# Patient Record
Sex: Female | Born: 2000 | Race: Black or African American | Hispanic: No | Marital: Single | Smoking: Never smoker
Health system: Southern US, Community
[De-identification: ages and names within clinical notes are randomized; demographics above are authoritative.]

---
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
## 2018-12-03 ENCOUNTER — Other Ambulatory Visit: Payer: Self-pay

## 2018-12-03 DIAGNOSIS — Z20822 Contact with and (suspected) exposure to covid-19: Secondary | ICD-10-CM

## 2018-12-05 LAB — NOVEL CORONAVIRUS, NAA: SARS-CoV-2, NAA: NOT DETECTED

## 2023-12-05 ENCOUNTER — Other Ambulatory Visit (HOSPITAL_COMMUNITY)
Admission: RE | Admit: 2023-12-05 | Discharge: 2023-12-05 | Disposition: A | Discharge status: Home / Self Care | Source: Ambulatory Visit | Attending: Certified Nurse Midwife | Admitting: Certified Nurse Midwife

## 2023-12-05 ENCOUNTER — Ambulatory Visit: Payer: Self-pay | Admitting: Certified Nurse Midwife

## 2023-12-05 ENCOUNTER — Encounter: Payer: Self-pay | Admitting: Certified Nurse Midwife

## 2023-12-05 VITALS — BP 124/82 | HR 77 | Ht 64.0 in | Wt 159.0 lb

## 2023-12-05 DIAGNOSIS — Z124 Encounter for screening for malignant neoplasm of cervix: Secondary | ICD-10-CM

## 2023-12-05 DIAGNOSIS — F419 Anxiety disorder, unspecified: Secondary | ICD-10-CM | POA: Insufficient documentation

## 2023-12-05 DIAGNOSIS — Z1331 Encounter for screening for depression: Secondary | ICD-10-CM

## 2023-12-05 DIAGNOSIS — Z30011 Encounter for initial prescription of contraceptive pills: Secondary | ICD-10-CM | POA: Insufficient documentation

## 2023-12-05 DIAGNOSIS — Z01419 Encounter for gynecological examination (general) (routine) without abnormal findings: Secondary | ICD-10-CM

## 2023-12-05 DIAGNOSIS — Z0000 Encounter for general adult medical examination without abnormal findings: Secondary | ICD-10-CM | POA: Insufficient documentation

## 2023-12-05 NOTE — Progress Notes (Signed)
 Patient presents for Annual. PHQ9=5  GAD 7=5 LMP: 11/19/23 Monthly light flow lasting 2-3 days Last pap: 2024 at [facility]  Needs to sign ROI Contraception: OCP since 2016  Mammogram: Not yet indicated Family Hx of Breast Breast Cancer P.Cousin STD Screening: Accepts  CC: Anxiety w/School and everyday life stressors.

## 2023-12-05 NOTE — Progress Notes (Signed)
   GYNECOLOGY OFFICE VISIT NOTE  History:   Lacey Carter is a 23 y.o. G0P0000 here today for a annual well woman exam. Patient is a transfer to us  from [facility]. 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 She is currently on OCPs and has no complaints. She reports a normal 21-25 day cycle with a light 3-5 day bleed. She is currently sexually active with a long-term boyfriend. She desires STD testing today. She denies any abnormal vaginal discharge, bleeding, pelvic pain or other concerns.  She does report a generalized anxiety that she has notice worsen with age. She states that sometimes it becomes debilitating and is unable to perform tasks. Denies having a counselor or coping mechanisms for stress. Denies being on anti-anxiety medication     History reviewed. No pertinent past medical history.  History reviewed. No pertinent surgical history.  The following portions of the patient's history were reviewed and updated as appropriate: allergies, current medications, past family history, past medical history, past social history, past surgical history and problem list.   Health Maintenance:  Patient denies a hx of abnormal PAP. Plan to complete testing today.   Review of Systems:  Pertinent items noted in HPI and remainder of comprehensive ROS otherwise negative.  Physical Exam:  BP 124/82   Pulse 77   Ht 5' 4 (1.626 m)   Wt 159 lb (72.1 kg)   LMP 11/19/2023 (Approximate)   BMI 27.29 kg/m  CONSTITUTIONAL: Well-developed, well-nourished female in no acute distress.  HEENT:  Normocephalic, atraumatic. External right and left ear normal. No scleral icterus.  NECK: Normal range of motion, supple, no masses noted on observation SKIN: No rash noted. Not diaphoretic. No erythema. No pallor. MUSCULOSKELETAL: Normal range of motion. No edema noted. NEUROLOGIC: Alert and oriented to person, place, and time. Normal muscle tone coordination. No cranial nerve deficit  noted. PSYCHIATRIC: Normal mood and affect. Normal behavior. Normal judgment and thought content. CARDIOVASCULAR: Normal heart rate noted RESPIRATORY: Effort and breath sounds normal, no problems with respiration noted ABDOMEN: No masses noted. No other overt distention noted.   PELVIC: Normal appearing external genitalia; normal urethral meatus; normal appearing vaginal mucosa and cervix.  No abnormal discharge noted.  Normal uterine size, no other palpable masses, no uterine or adnexal tenderness. Performed in the presence of a chaperone L. Lash CMA   Labs and Imaging No results found for this or any previous visit (from the past week). No results found.    Assessment and Plan:    1. Annual physical exam (Primary) - Patient doing well.   2. Pap smear for cervical cancer screening - Pap collected today.  - Cytology - PAP  3. Anxiety - Recommended to start with the use of a counselor. And if care needs to escalate to medication or psych then we can. Patient agreeable to plan of care.  - AMB referral to [facility]  - Cytology - PAP   Routine preventative health maintenance measures emphasized. Please refer to After Visit Summary for other counseling recommendations.   Return in about 1 year (around 12/04/2024) for [place].    I spent 30 minutes dedicated to the care of this patient including pre-visit review of records, face to face time with the patient discussing her conditions and treatments and post visit orders.    Radley Barto Erven) Emilio, MSN, CNM  Center for [facility]  12/05/23 5:56 PM

## 2023-12-06 ENCOUNTER — Telehealth: Payer: Self-pay | Admitting: Clinical

## 2023-12-06 NOTE — Telephone Encounter (Signed)
Attempt call regarding referral; Left HIPPA-compliant message to call back Mikle Sternberg from [facility] at [facility] at  [phone] (Ashelyn Mccravy's office).    

## 2023-12-11 LAB — CYTOLOGY - PAP
Comment: NEGATIVE
Diagnosis: NEGATIVE
High risk HPV: NEGATIVE

## 2023-12-24 ENCOUNTER — Ambulatory Visit: Payer: Self-pay

## 2023-12-24 DIAGNOSIS — F411 Generalized anxiety disorder: Secondary | ICD-10-CM | POA: Diagnosis not present

## 2023-12-24 NOTE — Patient Instructions (Signed)
[facility] at [facility] [address] [place], [place] [address] [phone] (main office) [phone] (Jamie's office)  SCORE: For the life of your business [email or web address]

## 2023-12-24 NOTE — BH Specialist Note (Unsigned)
 Integrated Behavioral Health via Telemedicine Visit  12/25/2023 Lacey Carter [id number]  Number of Integrated Behavioral Health Clinician visits: 1- Initial Visit  Session Start time: 1201   Session End time: 1303  Total time in minutes: 62  Referring Provider: Delilah Carter, CNM Patient/Family location: Home Lacey Carter Provider location: [facility]  All persons participating in visit: Patient Lacey Carter and Lacey Carter Lacey Carter   Types of Service: Individual psychotherapy and Video visit  I connected with Lacey Carter and/or Lacey Carter's n/a via  Telephone or Video Enabled Telemedicine Application  (Video is Caregility application) and verified that I am speaking with the correct person using two identifiers. Discussed confidentiality: Yes   I discussed the limitations of telemedicine and the availability of in person appointments.  Discussed there is a possibility of technology failure and discussed alternative modes of communication if that failure occurs.  I discussed that engaging in this telemedicine visit, they consent to the provision of behavioral healthcare and the services will be billed under their insurance.  Patient and/or legal guardian expressed understanding and consented to Telemedicine visit: Yes   Presenting Concerns: Patient and/or family reports the following symptoms/concerns: Increased anxiety; attributes to being micro-managed and feelings invalidated by family since childhood; recent fear of needles after negative experience donating blood. 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  Duration of problem: Ongoing; Severity of problem: moderate  Patient and/or Family's Strengths/Protective Factors: Sense of purpose  Goals Addressed: Patient will:  Reduce symptoms of: anxiety and stress   Increase knowledge and/or  ability of: healthy habits and self-management skills   Demonstrate ability to: Increase healthy adjustment to current life circumstances, Increase adequate support systems for patient/family, and Increase motivation to adhere to plan of care  Progress towards Goals: Ongoing    Interventions: Interventions utilized:  Mindfulness or Relaxation Training and ACT (Acceptance and Commitment Therapy) Standardized Assessments completed: Not Needed    Patient and/or Family Response: Patient agrees with treatment plan.   Clinical Assessment/Diagnosis  Generalized anxiety disorder   Patient may benefit from psychoeducation and brief therapeutic interventions regarding coping with symptoms of anxiety .  Plan: Follow up with behavioral health clinician on : Two weeks Behavioral recommendations:  -CALM relaxation breathing exercise twice daily (morning; at bedtime with sleep sounds); as needed throughout the day. -Begin using Emotion Wheel to identify exactly what you are feeling daily; consider writing down the exact emotion at least once daily, for greater awareness and self-validation.  -Consider SCORE (on AVS) as future business resource -Continue plan to graduate from [employer] [facility] on May 02, 2024 Referral(s): Integrated Art Gallery Manager (In Clinic) and [employer]:  SCORE business resource  I discussed the assessment and treatment plan with the patient and/or parent/guardian. They were provided an opportunity to ask questions and all were answered. They agreed with the plan and demonstrated an understanding of the instructions.   They were advised to call back or seek an in-person evaluation if the symptoms worsen or if the condition fails to improve as anticipated.  Lacey JAYSON Mering, LCSW     12/05/2023    2:59 PM  Depression screen PHQ 2/9  Decreased Interest 2  Down, Depressed, Hopeless 0  PHQ - 2 Score 2  Altered sleeping 1  Tired, decreased  energy 1  Change in appetite 1  Feeling bad or failure about yourself  0  Trouble concentrating 0  Moving slowly or fidgety/restless 0  Suicidal thoughts 0  PHQ-9 Score 5  Difficult doing work/chores Not difficult at all      12/05/2023    2:59 PM  GAD 7 : Generalized Anxiety Score  Nervous, Anxious, on Edge 2  Control/stop worrying 0  Worry too much - different things 1  Trouble relaxing 0  Restless 0  Easily annoyed or irritable 1  Afraid - awful might happen 1  Total GAD 7 Score 5  Anxiety Difficulty Not difficult at all

## 2024-01-01 ENCOUNTER — Other Ambulatory Visit: Payer: Self-pay | Admitting: Certified Nurse Midwife

## 2024-01-01 DIAGNOSIS — Z3040 Encounter for surveillance of contraceptives, unspecified: Secondary | ICD-10-CM

## 2024-01-07 ENCOUNTER — Ambulatory Visit: Payer: Self-pay | Admitting: Clinical

## 2024-01-07 DIAGNOSIS — Z91199 Patient's noncompliance with other medical treatment and regimen due to unspecified reason: Secondary | ICD-10-CM

## 2024-01-07 NOTE — BH Specialist Note (Signed)
 Pt did not arrive to video visit and did not answer the phone; Left HIPPA-compliant message to call back Warren from [employer] for [employer] at [facility] at  [phone] [facility] office).  ?; left MyChart message for patient.  ? ?
